# Patient Record
Sex: Male | Born: 1975 | Race: White | Hispanic: No | Marital: Single | Smoking: Current every day smoker
Health system: Southern US, Community
[De-identification: ages and names within clinical notes are randomized; demographics above are authoritative.]

## PROBLEM LIST (undated history)

## (undated) DIAGNOSIS — R569 Unspecified convulsions: Secondary | ICD-10-CM

## (undated) DIAGNOSIS — I10 Essential (primary) hypertension: Secondary | ICD-10-CM

## (undated) HISTORY — PX: HERNIA REPAIR: SHX51

## (undated) HISTORY — DX: Essential (primary) hypertension: I10

## (undated) HISTORY — PX: OTHER SURGICAL HISTORY: SHX169

## (undated) HISTORY — DX: Unspecified convulsions: R56.9

---

## 2003-11-13 ENCOUNTER — Other Ambulatory Visit: Payer: Self-pay

## 2004-05-12 ENCOUNTER — Other Ambulatory Visit: Payer: Self-pay

## 2004-11-11 ENCOUNTER — Emergency Department: Payer: Self-pay | Admitting: Emergency Medicine

## 2005-09-06 ENCOUNTER — Emergency Department: Payer: Self-pay | Admitting: Emergency Medicine

## 2006-04-26 ENCOUNTER — Emergency Department: Payer: Self-pay | Admitting: Emergency Medicine

## 2006-06-13 ENCOUNTER — Emergency Department: Payer: Self-pay | Admitting: Emergency Medicine

## 2007-02-26 ENCOUNTER — Emergency Department: Payer: Self-pay | Admitting: Emergency Medicine

## 2010-02-19 ENCOUNTER — Ambulatory Visit: Payer: Self-pay | Admitting: Gastroenterology

## 2013-07-11 ENCOUNTER — Ambulatory Visit: Payer: Self-pay | Admitting: Neurology

## 2013-08-02 ENCOUNTER — Other Ambulatory Visit: Payer: Self-pay

## 2013-08-02 MED ORDER — KEPPRA XR 750 MG PO TB24: 750.0000 mg | ORAL_TABLET | Freq: Two times a day (BID) | ORAL | Status: DC

## 2013-08-05 ENCOUNTER — Telehealth: Payer: Self-pay | Admitting: Neurology

## 2013-08-05 NOTE — Telephone Encounter (Signed)
The patient called. He had a recurrent seizure today. He works in Engineering ge[employer], and he needs a note to return to work. He is not to drive a car. I will send an e-mail to his employer at [email or web address]. He will need a revisit.

## 2013-08-28 ENCOUNTER — Encounter: Payer: Self-pay | Admitting: Neurology

## 2013-08-29 ENCOUNTER — Encounter: Payer: Self-pay | Admitting: Neurology

## 2013-08-29 ENCOUNTER — Ambulatory Visit (INDEPENDENT_AMBULATORY_CARE_PROVIDER_SITE_OTHER): Payer: Managed Care, Other (non HMO) | Admitting: Neurology

## 2013-08-29 VITALS — BP 135/81 | HR 54 | Resp 16 | Ht 71.5 in | Wt 185.0 lb

## 2013-08-29 DIAGNOSIS — G40802 Other epilepsy, not intractable, without status epilepticus: Secondary | ICD-10-CM

## 2013-08-29 MED ORDER — KEPPRA XR 750 MG PO TB24: 750.0000 mg | ORAL_TABLET | Freq: Two times a day (BID) | ORAL | Status: DC

## 2013-08-29 NOTE — Progress Notes (Signed)
[facility]  Provider:  Melvyn Novas, M D  Referring Provider: Barbette Reichmann, MD Primary Care Physician:  Barbette Reichmann, MD  Chief Complaint  Patient presents with  . Follow-up    seizures, rm 11    HPI:  Kevin Vargas is a 37 y.o. male  Is seen here as a referral/ revisit  from Dr. Marcello Fennel . Chief Complaint  Patient presents with  . Follow-up    seizures, rm 11    KevinVargas is my patient for many years, followed for a seizure disorder.   Directly, a 37 year old Caucasian right-handed male patient has a history of secondary generalized seizures, but were well controlled on Keppra. He had some breakthrough activity in the past when he ran out of medications. 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 He has a full time job and has been compliant since being able to afford medication.  He is here today after recent breakthrough seizure activity in July- at work  , feeling fainty and his BP was low. The last one on August 30 is, the Saturday before Labor Day, observed by co workers in the break room. He can not find any triggers in retrospect , only  possible dehydration on a very hot day.  He injured his shoulder, and had a bump on his head.   The injuries recovered well .  He has now some paperwork to do for his employer.    PLAN:  I will refill his medications , keppra  xr 750 mg bid po.  Levels drawn , as he had not been seen in the ED after seizures.        Review of Systems: Out of a complete 14 system review, the patient complains of only the following symptoms, and all other reviewed systems are negative.  none   History   Social History  . Marital Status: Single    Spouse Name: N/A    Number of Children: 1  . Years of Education: college   Occupational History  .      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 Smoking status: Current Every Day Smoker -- 12.50  packs/day  . Smokeless tobacco: Former Neurosurgeon     Comment: smokes 3 cigars daily, smokeless tobacco in 2004  . Alcohol Use: Yes     Comment: 3 beers daily  . Drug Use: Yes    Special: Marijuana     Comment: quarter wkly  . Sexual Activity: Not on file   Other Topics Concern  . Not on file   Social History Narrative   Patient consumes 2 cups of coffee daily, is right handed.    Family History  Problem Relation Age of Onset  . Diabetes Maternal Grandmother   . Lung cancer Maternal Grandmother     Past Medical History  Diagnosis Date  . Hypertension   . Seizures     temporal lobe  . Alcohol related seizure     08/12,11/12,7/13    Past Surgical History  Procedure Laterality Date  . Appendectomy    . Hernia repair      Current Outpatient Prescriptions  Medication Sig Dispense Refill  . KEPPRA XR 750 MG TB24 Take 1 tablet (750 mg total) by mouth 2 (two) times daily.  180 tablet  3  . LOSARTAN POTASSIUM PO Take 50 mg by mouth daily.  No current facility-administered medications for this visit.    Allergies as of 08/29/2013  . (No Known Allergies)    Vitals: BP 135/81  Pulse 54  Resp 16  Ht 5' 11.5" (1.816 m)  Wt 185 lb (83.915 kg)  BMI 25.45 kg/m2 Last Weight:  Wt Readings from Last 1 Encounters:  08/29/13 185 lb (83.915 kg)   Last Height:   Ht Readings from Last 1 Encounters:  08/29/13 5' 11.5" (1.816 m)    Physical exam:  General: The patient is awake, alert and appears not in acute distress. The patient is well groomed. Head: Normocephalic, atraumatic. Neck is supple. Mallampati 2, neck circumference: 14.5 , inches - no nasal deviation and no TMJ.  Cardiovascular:  Regular rate and rhythm , without  murmurs or carotid bruit, and without distended neck veins. Respiratory: Lungs are clear to auscultation. Skin:  Without evidence of edema, or rash Trunk: BMI is elevated . Neurologic exam : The patient is awake and alert, oriented to place and  time.  Memory subjective  described as intact. There is a normal attention span & concentration ability.  Speech is fluent without  dysarthria, dysphonia or aphasia. Mood and affect are appropriate.  Cranial nerves: Pupils are equal and briskly reactive to light. Funduscopic exam without  evidence of pallor or edema. Extraocular movements  in vertical and horizontal planes intact and without nystagmus. Visual fields by finger perimetry are intact. Hearing to finger rub intact.  Facial sensation intact to fine touch. Facial motor strength is symmetric and tongue and uvula move midline.  Motor exam:   Normal tone and normal muscle bulk and symmetric normal strength in all extremities.  Sensory:  Fine touch, pinprick and vibration were tested in all extremities. Proprioception is  normal.  Coordination: Rapid alternating movements in the fingers/hands is tested and normal. Finger-to-nose maneuver tested and normal without evidence of ataxia, dysmetria or tremor.  Gait and station: Patient walks without assistive device and is able and assisted stool climb up to the exam table. Strength within normal limits. Stance is stable and normal. Tandem gait is  unfragmented. Romberg testing is normal.  Deep tendon reflexes: in the  upper and lower extremities are symmetric and intact. Babinski maneuver  downgoing.   Assessment:  After physical and neurologic examination, review of laboratory studies, imaging, neurophysiology testing and pre-existing records:  Seizure disorder, temporal lobe  onset  #2 hypertension now all on medication occasionally hypotension and orthostatic dizziness.   HEhas regular habits of eating and sleeping, he does not drink any alcohol he has not been using cocaine since 2011, he did have seizures approximately once a year on average. In the past he sometimes had a tongue bite but is not the case today and there are no external injuries seen does the face or scalp.  Patient will  have his medications refilled today, I would also like to obtain a blood level for Keppra and since he seems to have breakthrough activity while in compliant with medications I consider adding a second drug. The patient is not to drive for the remainder of this calendar year, which will make it difficult for him to maintain his employment. Unable answer all the questions  his workplace has asked me to fill.   EEG repeat,  Last MRI was 2011.      Plan:  Treatment plan and additional workup :

## 2013-08-29 NOTE — Patient Instructions (Signed)
Driving and Equipment Restrictions °Some medical problems make it dangerous to drive, ride a bike, or use machines. Some of these problems are: °· A hard blow to the head (concussion). °· Passing out (fainting). °· Twitching and shaking (seizures). °· Low blood sugar. °· Taking medicine to help you relax (sedatives). °· Taking pain medicines. °· Wearing an eye patch. °· Wearing splints. This can make it hard to use parts of your body that you need to drive safely. °HOME CARE  °· Do not drive until your doctor says it is okay. °· Do not use machines until your doctor says it is okay. °You may need a form signed by your doctor (medical release) before you can drive again. You may also need this form before you do other tasks where you need to be fully alert. °MAKE SURE YOU: °· Understand these instructions. °· Will watch your condition. °· Will get help right away if you are not doing well or get worse. °Document Released: 12/31/2004 Document Revised: 02/15/2012 Document Reviewed: 04/02/2010 °ExitCare® Patient Information ©2014 [employer]. ° °Seizure, Adult °A seizure is abnormal electrical activity in the brain. Seizures can cause a change in attention or behavior (altered mental status). Seizures often involve uncontrollable shaking (convulsions). Seizures usually last from 30 seconds to 2 minutes. Epilepsy is a brain disorder in which a patient has repeated seizures over time. °CAUSES  °There are many different problems that can cause seizures. In some cases, no cause is found. Common causes of seizures include: °· Head injuries. °· Brain tumors. °· Infections. °· Imbalance of chemicals in the blood. °· Kidney failure or liver failure. °· Heart disease. °· Drug abuse. °· Stroke. °· Withdrawal from certain drugs or alcohol. °· Birth defects. °· Malfunction of a neurosurgical device placed in the brain. °SYMPTOMS  °Symptoms vary depending on the part of the brain that is involved. Right before a seizure, you may  have a warning (aura) that a seizure is about to occur. An aura may include the following symptoms:  °· Fear or anxiety. °· Nausea. °· Feeling like the room is spinning (vertigo). °· Vision changes, such as seeing flashing lights or spots. °Common symptoms during a seizure include: °· Convulsions. °· Drooling. °· Rapid eye movements. °· Grunting. °· Loss of bladder and bowel control. °· Bitter taste in the mouth. °After a seizure, you may feel confused and sleepy. You may also have an injury resulting from convulsions during the seizure. °DIAGNOSIS  °Your caregiver will perform a physical exam and run some tests to determine the type and cause of your seizure. These tests may include: °· Blood tests. °· A lumbar puncture test. In this test, a small amount of fluid is removed from the spine and examined. °· Electrocardiography (ECG). This test records the electrical activity in your heart. °· Imaging tests, such as computed tomography (CT) scans or magnetic resonance imaging (MRI). °· Electroencephalography (EEG). This test records the electrical activity in your brain. °TREATMENT  °Seizures usually stop on their own. Treatment will depend on the cause of your seizure. In some cases, medicine may be given to prevent future seizures. °HOME CARE INSTRUCTIONS  °· If you are given medicines, take them exactly as prescribed by your caregiver. °· Keep all follow-up appointments as directed by your caregiver. °· Do not swim or drive until your caregiver says it is okay. °· Teach friends and family what to do if you have a seizure. They should: °· Lay you on the ground to   prevent a fall. °· Put a cushion under your head. °· Loosen any tight clothing around your neck. °· Turn you on your side. If vomiting occurs, this helps keep your airway clear. °· Stay with you until you recover. °SEEK IMMEDIATE MEDICAL CARE IF: °· The seizure lasts longer than 2 to 5 minutes. °· The seizure is severe or the person does not wake up after  the seizure. °· The person has altered mental status. °Drive the person to the emergency department or call your local emergency services (911 in [place] °MAKE SURE YOU: °· Understand these instructions. °· Will watch your condition. °· Will get help right away if you are not doing well or get worse. °Document Released: 11/20/2000 Document Revised: 02/15/2012 Document Reviewed: 11/11/2011 °ExitCare® Patient Information ©2014 [employer]. ° °

## 2013-08-31 LAB — BASIC METABOLIC PANEL
BUN/Creatinine Ratio: 12 (ref 8–19)
BUN: 11 mg/dL (ref 6–20)
CO2: 25 mmol/L (ref 18–29)
Calcium: 9.9 mg/dL (ref 8.7–10.2)
Chloride: 99 mmol/L (ref 97–108)
Creatinine, Ser: 0.94 mg/dL (ref 0.76–1.27)
GFR calc non Af Amer: 102 mL/min/{1.73_m2} (ref >59)
Glucose: 81 mg/dL (ref 65–99)
Potassium: 4.7 mmol/L (ref 3.5–5.2)
Sodium: 143 mmol/L (ref 134–144)

## 2013-08-31 LAB — LEVETIRACETAM LEVEL: Levetiracetam Lvl: 21.3 ug/mL (ref 5.0–63.0)

## 2013-09-12 ENCOUNTER — Encounter: Payer: Self-pay | Admitting: Neurology

## 2013-09-14 ENCOUNTER — Ambulatory Visit (INDEPENDENT_AMBULATORY_CARE_PROVIDER_SITE_OTHER): Payer: Managed Care, Other (non HMO)

## 2013-09-14 DIAGNOSIS — G40802 Other epilepsy, not intractable, without status epilepticus: Secondary | ICD-10-CM

## 2013-09-26 NOTE — Progress Notes (Signed)
Quick Note:  I called pt and relayed that the MRI brain, normal study. ______

## 2013-10-23 ENCOUNTER — Ambulatory Visit (INDEPENDENT_AMBULATORY_CARE_PROVIDER_SITE_OTHER): Payer: Managed Care, Other (non HMO)

## 2013-10-23 DIAGNOSIS — G40802 Other epilepsy, not intractable, without status epilepticus: Secondary | ICD-10-CM

## 2013-10-23 NOTE — Procedures (Signed)
    History:  Kevin Vargas is a 37 year old gentleman with a history of generalized seizures, controlled on Keppra. The patient last had a breakthrough seizure in July of 2014. The patient is being evaluated for the seizures.  This is a routine EEG. No skull defects are noted. Medications include Keppra and losartan.   EEG classification: Normal awake  Description of the recording: The background rhythms of this recording consists of a fairly well modulated medium amplitude alpha rhythm of 9 Hz that is reactive to eye opening and closure. As the record progresses, the patient appears to remain in the waking state throughout the recording. Photic stimulation was performed, resulting in a bilateral and symmetric photic driving response. Hyperventilation was also performed, resulting in a minimal buildup of the background rhythm activities without significant slowing seen. At no time during the recording does there appear to be evidence of spike or spike wave discharges or evidence of focal slowing. EKG monitor shows no evidence of cardiac rhythm abnormalities with a heart rate of 78.  Impression: This is a normal EEG recording in the waking state. No evidence of ictal or interictal discharges are seen.

## 2013-12-20 ENCOUNTER — Ambulatory Visit: Payer: Managed Care, Other (non HMO) | Admitting: Nurse Practitioner

## 2014-01-16 ENCOUNTER — Other Ambulatory Visit: Payer: Self-pay

## 2014-01-16 DIAGNOSIS — G40802 Other epilepsy, not intractable, without status epilepticus: Secondary | ICD-10-CM

## 2014-01-16 MED ORDER — LEVETIRACETAM ER 750 MG PO TB24: 750.0000 mg | ORAL_TABLET | Freq: Two times a day (BID) | ORAL | Status: AC

## 2014-01-17 NOTE — Telephone Encounter (Signed)
Rx signed and faxed.

## 2014-02-02 ENCOUNTER — Telehealth: Payer: Self-pay | Admitting: Neurology

## 2014-02-02 NOTE — Telephone Encounter (Signed)
Dottie with Patient Assistance called.  She stated that they received the prescription for the Keppra XR, however there was no date on the prescription.  She asked if you could please refax the dated prescription to their office.  Their fax number is [phone] and reference Patient Id# [id number].  Their phone number is [phone].  Thank you

## 2014-02-02 NOTE — Telephone Encounter (Signed)
I pulled a copy of the Rx, and the date is clearly on it.  I have re-faxed the Rx and included the patient ID #.  Confirmation time stamped 1:32pm.

## 2014-02-13 ENCOUNTER — Telehealth: Payer: Self-pay | Admitting: Neurology

## 2014-02-13 NOTE — Telephone Encounter (Signed)
Pt had a seizure at work today.  He states that he feels fine.  However he will need a letter stating that he can return to work tomorrow.  Please call him with any questions.  Thank you

## 2014-02-14 ENCOUNTER — Encounter: Payer: Self-pay | Admitting: *Deleted

## 2014-02-14 NOTE — Telephone Encounter (Signed)
Patient calling stating that he had an seizure yesterday at work. Patient states that he feel fine. Patient wanted to know if a letter could be written stating that he can return to work tomorrow. Dr[facility] Vic[facility]ier [facility] Dr. Frances FurbishAthar. Please advise

## 2014-02-14 NOTE — Telephone Encounter (Signed)
Spoke to patient on his mobile phone. He reports feeling lightheaded in the morning yesterday. He had a feeling that maybe he will have a seizure later on. Around 12:30 or 1 PM he had a dj vu experience. He was out for about 5 minutes. Coworkers were there but EMS was not called as they had witnessed seizures with him before. He did not injure himself. He is not absolutely sure if he skipped his Keppra the night before. He reports that he is very religious about taking his medications. He also notes that since he has now obtained insurance he will no longer qualify for patient assistance and is worried about being able to afford Keppra. However, at this point, he still has 2 bottles left. He will have to talk to Dr. Vickey Hugerohmeier about switching to another medication. He knows not to drive. He reports not having had any alcohol or illicit drugs. He reports no significant dehydration or skipping meals. In order to get to work his father drives him. Usually his girlfriend will pick him up from work. He does not work at [employer] or with heavy machinery. He works at [employer] in [employer]. I told him I would furnish a letter for him to be able to go back to work tomorrow 08/18/2014, as long as he takes the seizure precautions and does not drive. He was in agreement. We can fax this letter to HR at [employer], he will call his back today with the fax number.  Brett CanalesSteve: Please furnish a letter stating that this patient has a known seizure disorder and [facility]. He is usually well-controlled and reports compliance with medication. He had a single breakthrough seizure on 08/16/2014 and feels back to baseline. He can return to work on 08/18/2014 so long as he does not work with heavy machinery, or at heights, and does not drive.

## 2014-02-14 NOTE — Telephone Encounter (Signed)
Please note corrections on dates below: Seizure was on 02/13/2014 and return to work date is 02/15/2014.

## 2014-02-14 NOTE — Telephone Encounter (Signed)
Pt called again this morning to check the status of this request.  He stated that it would need to be faxed to the attention of HR. Thank you

## 2014-02-16 ENCOUNTER — Telehealth: Payer: Self-pay | Admitting: Neurology

## 2014-02-16 NOTE — Telephone Encounter (Signed)
Called patient to inform him that his medication was ready to be picked up at the front desk and if he has any other problems, questions or concerns to call the office. Patient verbalized understanding.

## 2014-04-16 ENCOUNTER — Encounter: Payer: Self-pay | Admitting: Nurse Practitioner

## 2014-05-22 ENCOUNTER — Ambulatory Visit: Payer: Managed Care, Other (non HMO) | Admitting: Nurse Practitioner

## 2014-11-05 ENCOUNTER — Emergency Department: Payer: Self-pay | Admitting: Emergency Medicine

## 2015-12-21 IMAGING — CT CT HEAD WITHOUT CONTRAST
1 series · 15 of 30 positions shown, 19 images · non-contrast
Comparison: None.

CLINICAL DATA: Seizure at work.  Facial lacerations

EXAM:
CT HEAD WITHOUT CONTRAST
TECHNIQUE: Contiguous axial images were obtained from the base of the skull
through the vertex without intravenous contrast.

[Series 2: head wo · axial · 0.44mm/px · z∈[-168,-24]mm · 15 of 36 slices shown, 19 images]
[im 2/36  brain]
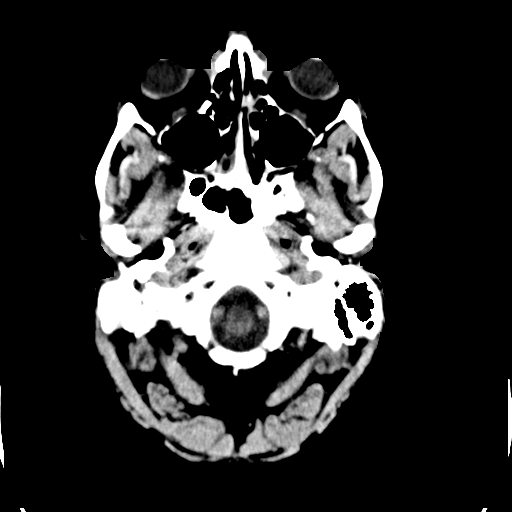
[im 2/36  bone]
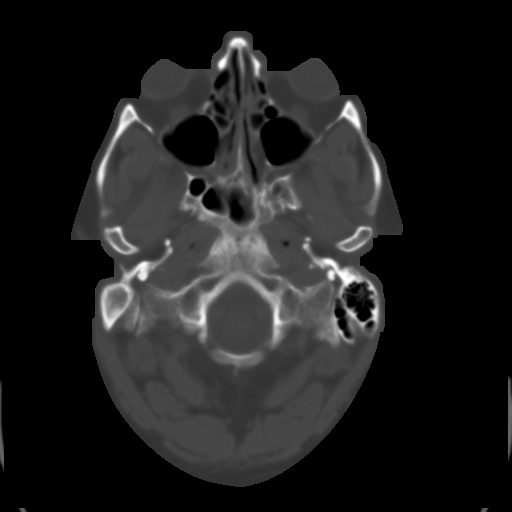
[im 4/36  brain]
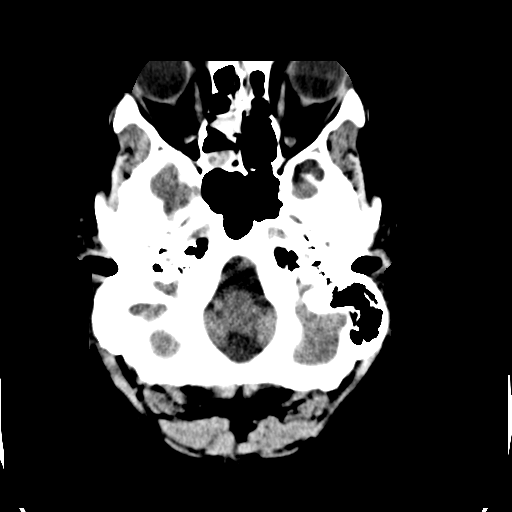
[im 7/36  brain]
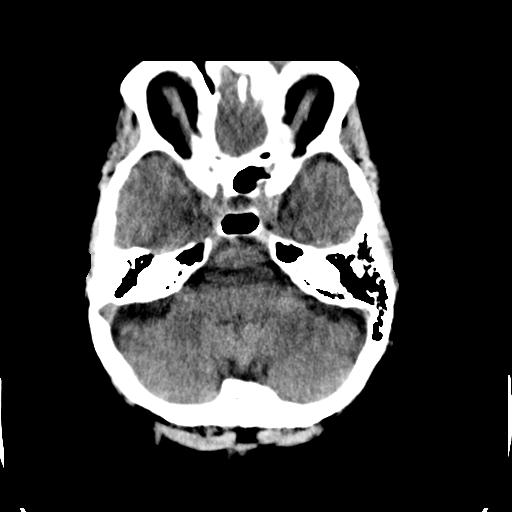
[im 9/36  brain]
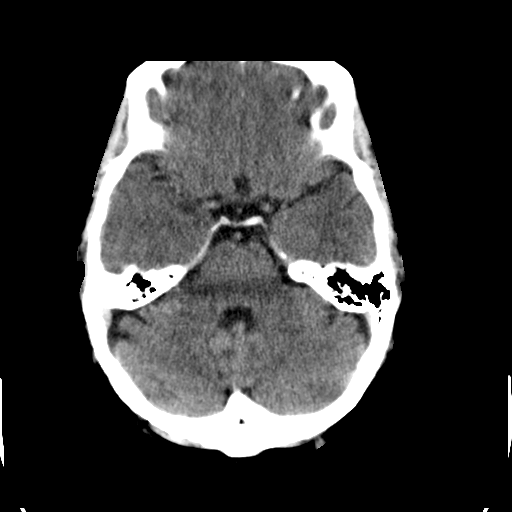
[im 11/36  brain]
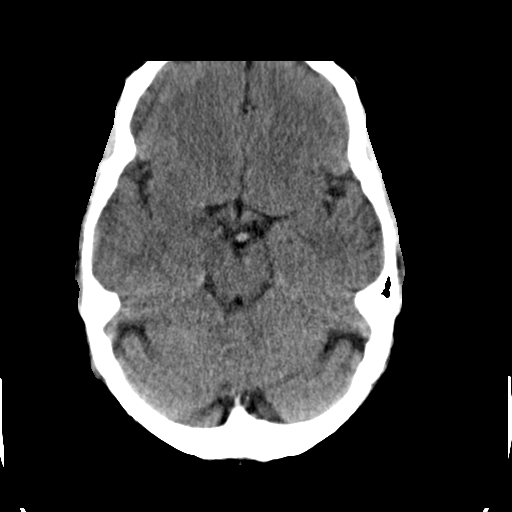
[im 11/36  bone]
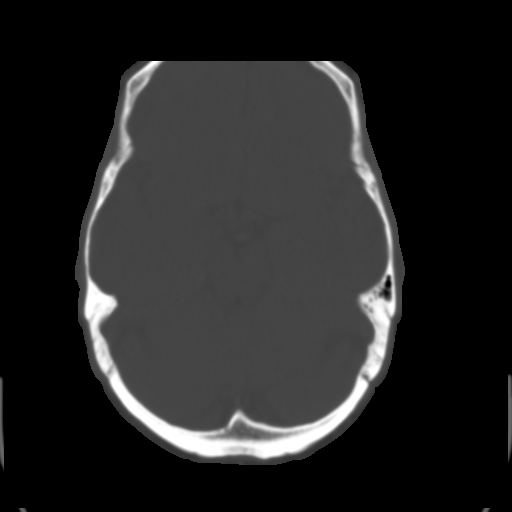
[im 14/36  brain]
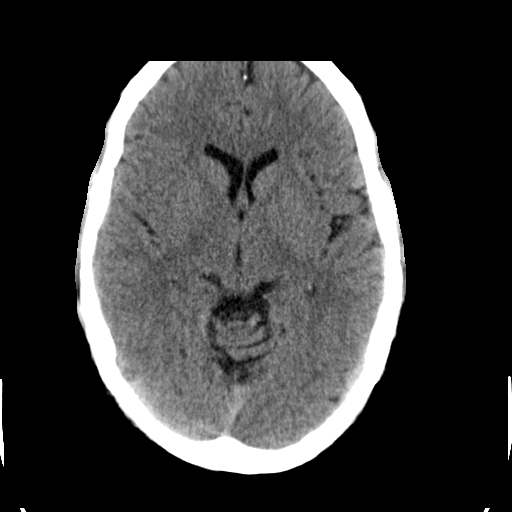
[im 16/36  brain]
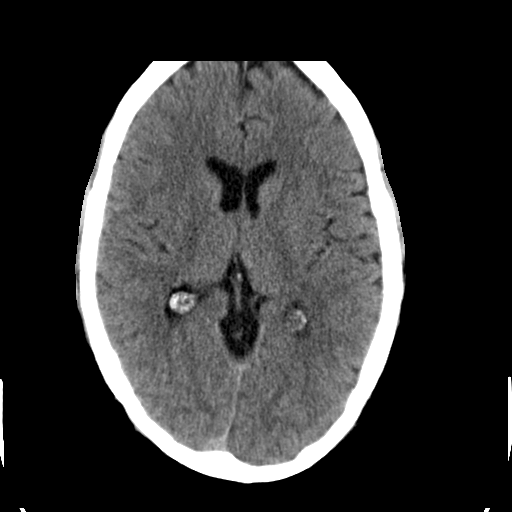
[im 19/36  brain]
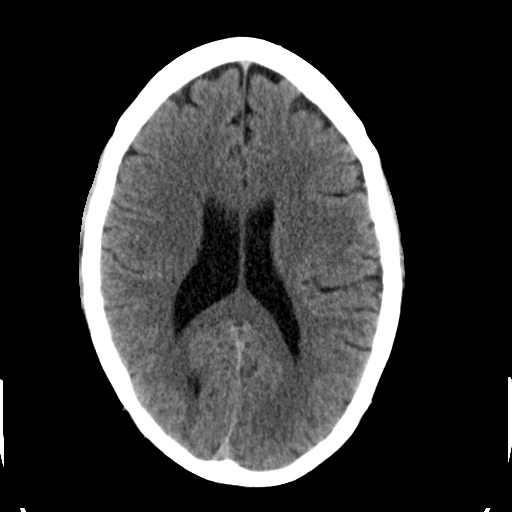
[im 20/36  brain]
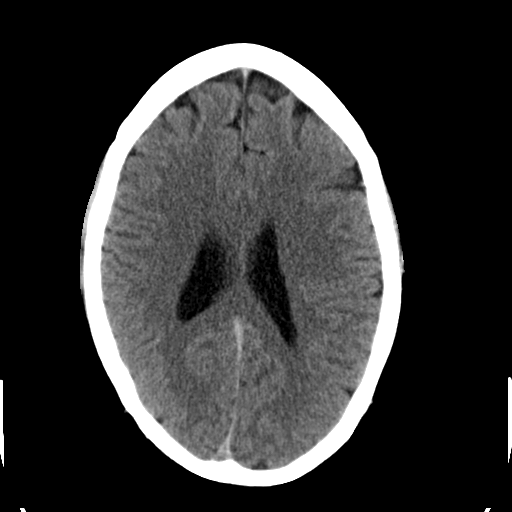
[im 20/36  bone]
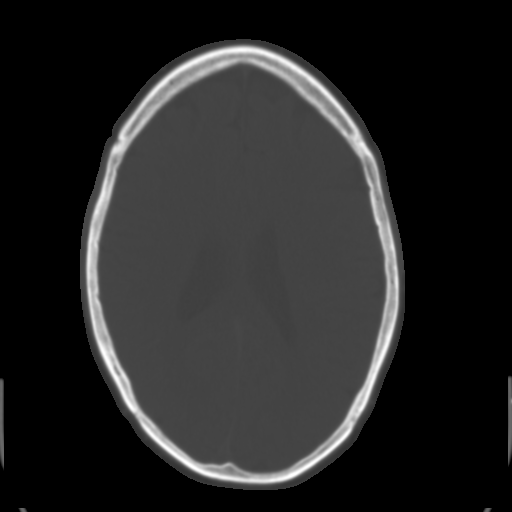
[im 22/36  brain]
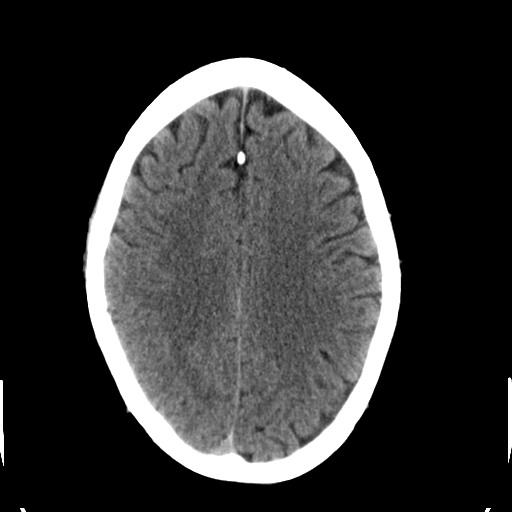
[im 25/36  brain]
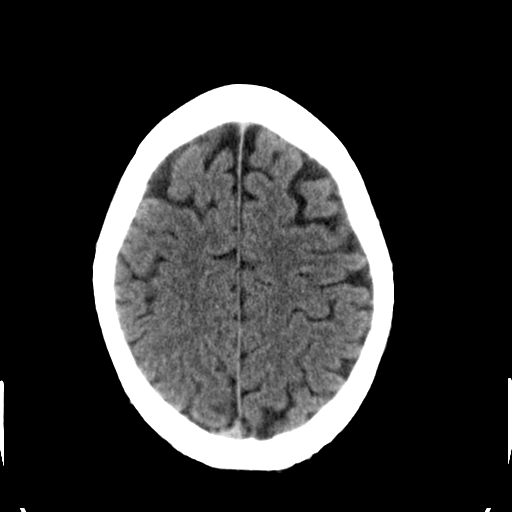
[im 27/36  brain]
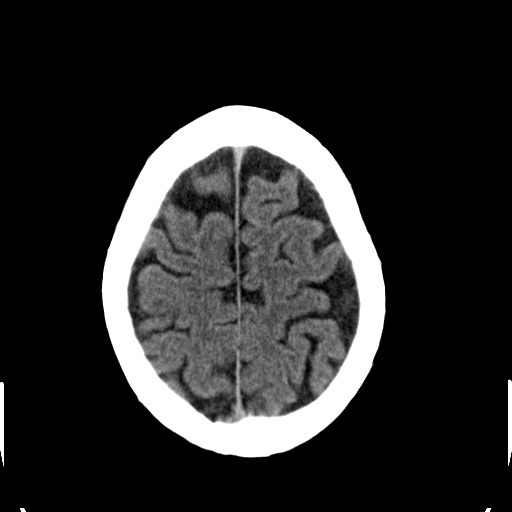
[im 29/36  brain]
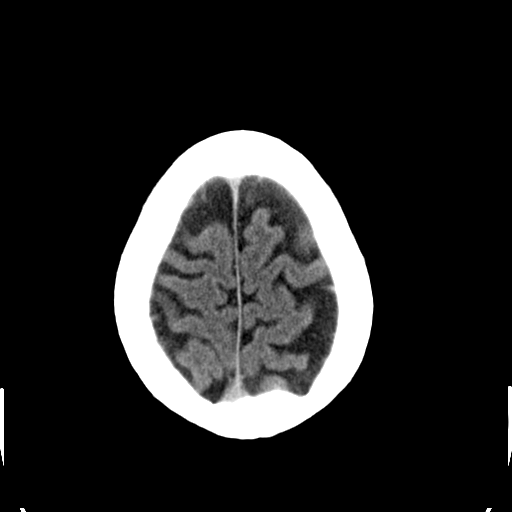
[im 29/36  bone]
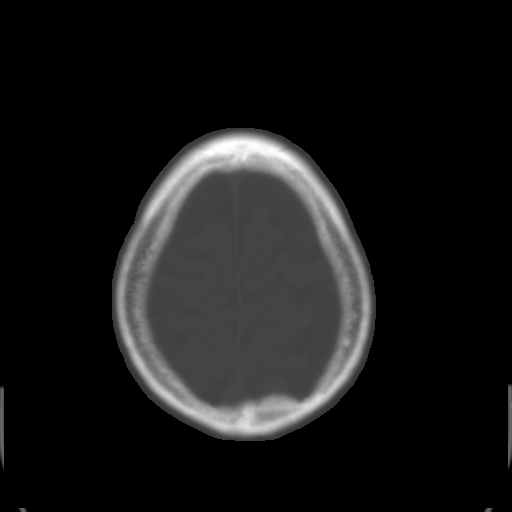
[im 32/36  brain]
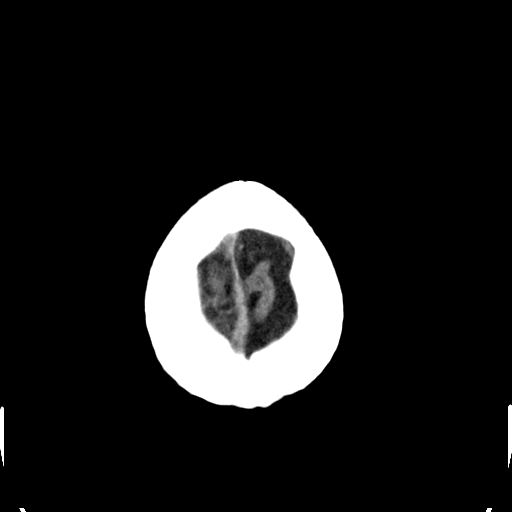
[im 34/36  brain]
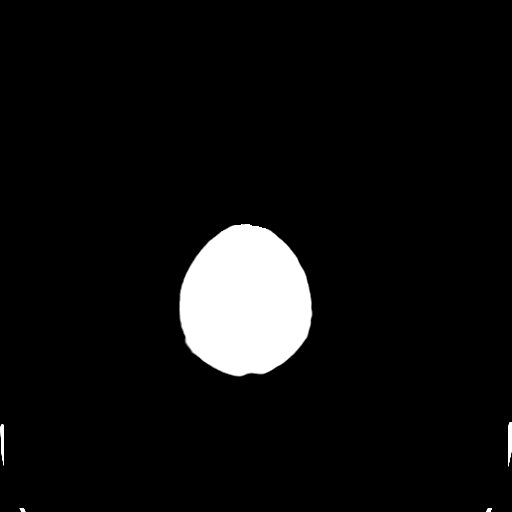

[15 of 30 positions shown; findings below may reference images not displayed]

FINDINGS: Sinuses/Soft tissues: Mucosal thickening of ethmoid air cells. Soft
tissue swelling in the right paracentral frontal scalp. Example
image 32 of series 3. Frontal sinus hyperattenuating fluid.
Nondisplaced fracture of the posterior wall of the the frontal
sinus. Example image 21 of series 3. Minimal fluid in the right
mastoid air cells. Left-sided mastoids are clear.

Intracranial: Age advanced cerebral atrophy. No mass lesion,
hemorrhage, hydrocephalus, acute infarct, intra-axial, or
extra-axial fluid collection.
IMPRESSION: 1. Frontal scalp soft tissue swelling. Nondisplaced fracture of the
posterior wall of the frontal sinus with presumably secondary
hemorrhage within the frontal sinus. This fracture may extend to the
superior aspect of the ethmoid air cells. Dedicated face CT may be
informative.
2. Cerebral atrophy without acute intracranial abnormality.
3. Right mastoid effusion.
